# Patient Record
Sex: Male | Born: 1975 | Race: White | Hispanic: No | Marital: Married | Smoking: Never smoker
Health system: Southern US, Community
[De-identification: ages and names within clinical notes are randomized; demographics above are authoritative.]

## PROBLEM LIST (undated history)

## (undated) DIAGNOSIS — F418 Other specified anxiety disorders: Secondary | ICD-10-CM

---

## 2016-08-12 ENCOUNTER — Ambulatory Visit
Admission: EM | Admit: 2016-08-12 | Discharge: 2016-08-12 | Disposition: A | Discharge status: Home / Self Care | Payer: BLUE CROSS/BLUE SHIELD | Attending: Family Medicine | Admitting: Family Medicine

## 2016-08-12 ENCOUNTER — Ambulatory Visit (INDEPENDENT_AMBULATORY_CARE_PROVIDER_SITE_OTHER): Payer: BLUE CROSS/BLUE SHIELD

## 2016-08-12 DIAGNOSIS — S60212A Contusion of left wrist, initial encounter: Secondary | ICD-10-CM | POA: Diagnosis not present

## 2016-08-12 MED ORDER — HYDROCODONE-ACETAMINOPHEN 5-325 MG PO TABS: ORAL_TABLET | ORAL | 0 refills | Status: AC

## 2016-08-12 NOTE — ED Triage Notes (Signed)
Pt was horse playing with his kids and slipped and fell on the floor last night.

## 2016-08-12 NOTE — ED Provider Notes (Signed)
[facility]    CSN: [id number] Arrival date & time: 08/12/16  0817     History   Chief Complaint Chief Complaint  Patient presents with  . Wrist Pain    Left wrist    HPI Bobby Combs is a 41 y.o. male.   The history is provided by the patient.  Wrist Pain  This is a new problem. The current episode started 12 to 24 hours ago (after falling on hard floor last night while playing with kids). The problem occurs constantly. The problem has not changed since onset.Pertinent negatives include no chest pain. He has tried nothing for the symptoms.    History reviewed. No pertinent past medical history.  There are no active problems to display for this patient.   History reviewed. No pertinent surgical history.     Home Medications    Prior to Admission medications   Medication Sig Start Date End Date Taking? Authorizing Provider  desvenlafaxine (PRISTIQ) 50 MG 24 hr tablet Take 50 mg by mouth daily.   Yes Historical Provider, MD  divalproex (DEPAKOTE ER) 500 MG 24 hr tablet Take by mouth daily.   Yes Historical Provider, MD  escitalopram (LEXAPRO) 20 MG tablet Take 20 mg by mouth daily.   Yes Historical Provider, MD  lamoTRIgine (LAMICTAL) 200 MG tablet Take 200 mg by mouth daily.   Yes Historical Provider, MD  traZODone (DESYREL) 50 MG tablet Take 50 mg by mouth at bedtime.   Yes Historical Provider, MD  HYDROcodone-acetaminophen (NORCO/VICODIN) 5-325 MG tablet 1-2 tabs po qd prn 08/12/16   Payton Mccallumrlando Liberty Seto, MD    Family History History reviewed. No pertinent family history.  Social History Social History  Substance Use Topics  . Smoking status: Never Smoker  . Smokeless tobacco: Never Used  . Alcohol use Yes     Allergies   Patient has no known allergies.   Review of Systems Review of Systems  Cardiovascular: Negative for chest pain.     Physical Exam Triage Vital Signs ED Triage Vitals  Enc Vitals Group     BP 08/12/16 0832 (!) 137/99   Pulse Rate 08/12/16 0832 76     Resp 08/12/16 0832 18     Temp 08/12/16 0832 98 F (36.7 C)     Temp Source 08/12/16 0832 Oral     SpO2 08/12/16 0832 98 %     Weight 08/12/16 0832 205 lb (93 kg)     Height 08/12/16 0832 6' (1.829 m)     Head Circumference --      Peak Flow --      Pain Score 08/12/16 0834 5     Pain Loc --      Pain Edu? --      Excl. in GC? --    No data found.   Updated Vital Signs BP (!) 137/99 (BP Location: Left Arm)   Pulse 76   Temp 98 F (36.7 C) (Oral)   Resp 18   Ht 6' (1.829 m)   Wt 205 lb (93 kg)   SpO2 98%   BMI 27.80 kg/m   Visual Acuity Right Eye Distance:   Left Eye Distance:   Bilateral Distance:    Right Eye Near:   Left Eye Near:    Bilateral Near:     Physical Exam  Constitutional: He appears well-developed and well-nourished. No distress.  Musculoskeletal:       Left wrist: He exhibits tenderness, bony tenderness and swelling (mild). He exhibits  normal range of motion, no effusion, no crepitus, no deformity and no laceration.  Skin: He is not diaphoretic.  Nursing note and vitals reviewed.    UC Treatments / Results  Labs (all labs ordered are listed, but only abnormal results are displayed) Labs Reviewed - No data to display  EKG  EKG Interpretation None       Radiology Dg Wrist Complete Left  Result Date: 08/12/2016 CLINICAL DATA:  Pain secondary to a fall last night. EXAM: LEFT WRIST - COMPLETE 3+ VIEW COMPARISON:  None. FINDINGS: There is no evidence of fracture or dislocation. There is no evidence of arthropathy or other focal bone abnormality. Small accessory ossification center in the triangular fibrocartilage. IMPRESSION: Negative. Electronically Signed   By: Francene Boyers M.D.   On: 08/12/2016 09:12    Procedures Procedures (including critical care time)  Medications Ordered in UC Medications - No data to display   Initial Impression / Assessment and Plan / UC Course  I have reviewed the triage  vital signs and the nursing notes.  Pertinent labs & imaging results that were available during my care of the patient were reviewed by me and considered in my medical decision making (see chart for details).       Final Clinical Impressions(s) / UC Diagnoses   Final diagnoses:  Contusion of left wrist, initial encounter    New Prescriptions Discharge Medication List as of 08/12/2016  9:25 AM    START taking these medications   Details  HYDROcodone-acetaminophen (NORCO/VICODIN) 5-325 MG tablet 1-2 tabs po qd prn, Print       1. x-ray results (negative for fracture) and diagnosis reviewed with patient 2. rx as per orders above; reviewed possible side effects, interactions, risks and benefits  3. Recommend supportive treatment with ice, rest, wrist splint prn, range of motion  4. Follow-up prn if symptoms worsen or don't improve   [name], MD 08/12/16 [phone]

## 2016-09-25 ENCOUNTER — Encounter: Payer: Self-pay | Admitting: *Deleted

## 2016-09-25 ENCOUNTER — Ambulatory Visit
Admission: EM | Admit: 2016-09-25 | Discharge: 2016-09-25 | Disposition: A | Discharge status: Home / Self Care | Payer: BLUE CROSS/BLUE SHIELD | Attending: Family Medicine | Admitting: Family Medicine

## 2016-09-25 DIAGNOSIS — S60212A Contusion of left wrist, initial encounter: Secondary | ICD-10-CM

## 2016-09-25 HISTORY — DX: Other specified anxiety disorders: F41.8

## 2016-09-25 NOTE — ED Provider Notes (Signed)
[facility]    CSN: [id number] Arrival date & time: 09/25/16  1041     History   Chief Complaint Chief Complaint  Patient presents with  . Follow-up  . Wrist Injury    HPI Bobby Combs is a 41 y.o. male.   41 yo male with a h/o left wrist contusion one month ago here for recheck and to get note for work stating he has no restrictions. Does not have any other complaints. States his symptoms resolved and his wrist feels fine.    The history is provided by the patient.    Past Medical History:  Diagnosis Date  . Depression with anxiety     There are no active problems to display for this patient.   History reviewed. No pertinent surgical history.     Home Medications    Prior to Admission medications   Medication Sig Start Date End Date Taking? Authorizing Provider  desvenlafaxine (PRISTIQ) 50 MG 24 hr tablet Take 50 mg by mouth daily.   Yes Historical Provider, MD  divalproex (DEPAKOTE ER) 500 MG 24 hr tablet Take by mouth daily.   Yes Historical Provider, MD  escitalopram (LEXAPRO) 20 MG tablet Take 20 mg by mouth daily.   Yes Historical Provider, MD  lamoTRIgine (LAMICTAL) 200 MG tablet Take 200 mg by mouth daily.   Yes Historical Provider, MD  traZODone (DESYREL) 50 MG tablet Take 50 mg by mouth at bedtime.   Yes Historical Provider, MD  HYDROcodone-acetaminophen (NORCO/VICODIN) 5-325 MG tablet 1-2 tabs po qd prn 08/12/16   Payton Mccallumrlando Michaiah Holsopple, MD    Family History History reviewed. No pertinent family history.  Social History Social History  Substance Use Topics  . Smoking status: Never Smoker  . Smokeless tobacco: Never Used  . Alcohol use Yes     Allergies   Patient has no known allergies.   Review of Systems Review of Systems   Physical Exam Triage Vital Signs ED Triage Vitals  Enc Vitals Group     BP 09/25/16 1226 120/85     Pulse Rate 09/25/16 1226 62     Resp 09/25/16 1226 16     Temp 09/25/16 1226 98.2 F (36.8 C)   Temp Source 09/25/16 1226 Oral     SpO2 09/25/16 1226 100 %     Weight --      Height --      Head Circumference --      Peak Flow --      Pain Score 09/25/16 1230 0     Pain Loc --      Pain Edu? --      Excl. in GC? --    No data found.   Updated Vital Signs BP 120/85 (BP Location: Left Arm)   Pulse 62   Temp 98.2 F (36.8 C) (Oral)   Resp 16   SpO2 100%   Visual Acuity Right Eye Distance:   Left Eye Distance:   Bilateral Distance:    Right Eye Near:   Left Eye Near:    Bilateral Near:     Physical Exam  Constitutional: He appears well-developed and well-nourished. No distress.  Musculoskeletal:       Left wrist: Normal.  Skin: He is not diaphoretic.  Nursing note and vitals reviewed.    UC Treatments / Results  Labs (all labs ordered are listed, but only abnormal results are displayed) Labs Reviewed - No data to display  EKG  EKG Interpretation None  Radiology No results found.  Procedures Procedures (including critical care time)  Medications Ordered in UC Medications - No data to display   Initial Impression / Assessment and Plan / UC Course  I have reviewed the triage vital signs and the nursing notes.  Pertinent labs & imaging results that were available during my care of the patient were reviewed by me and considered in my medical decision making (see chart for details).       Final Clinical Impressions(s) / UC Diagnoses   Final diagnoses:  Contusion of left wrist, initial encounter    New Prescriptions Discharge Medication List as of 09/25/2016 12:59 PM     1. Resolved 2. Patient may return to his regular job duties without any restrictions.  3. f/u prn   [name], MD 09/25/16 [phone]

## 2016-09-25 NOTE — Discharge Instructions (Signed)
May return to work without restrictions.

## 2016-09-25 NOTE — ED Triage Notes (Signed)
Patient has arrived for left wrist injury follow up and release to work note.

## 2018-12-25 IMAGING — CR DG WRIST COMPLETE 3+V*L*
4 series · 4 of 4 positions shown · non-contrast
Comparison: None.

CLINICAL DATA: Pain secondary to a fall last night.

EXAM:
LEFT WRIST - COMPLETE 3+ VIEW

[wrist pa]
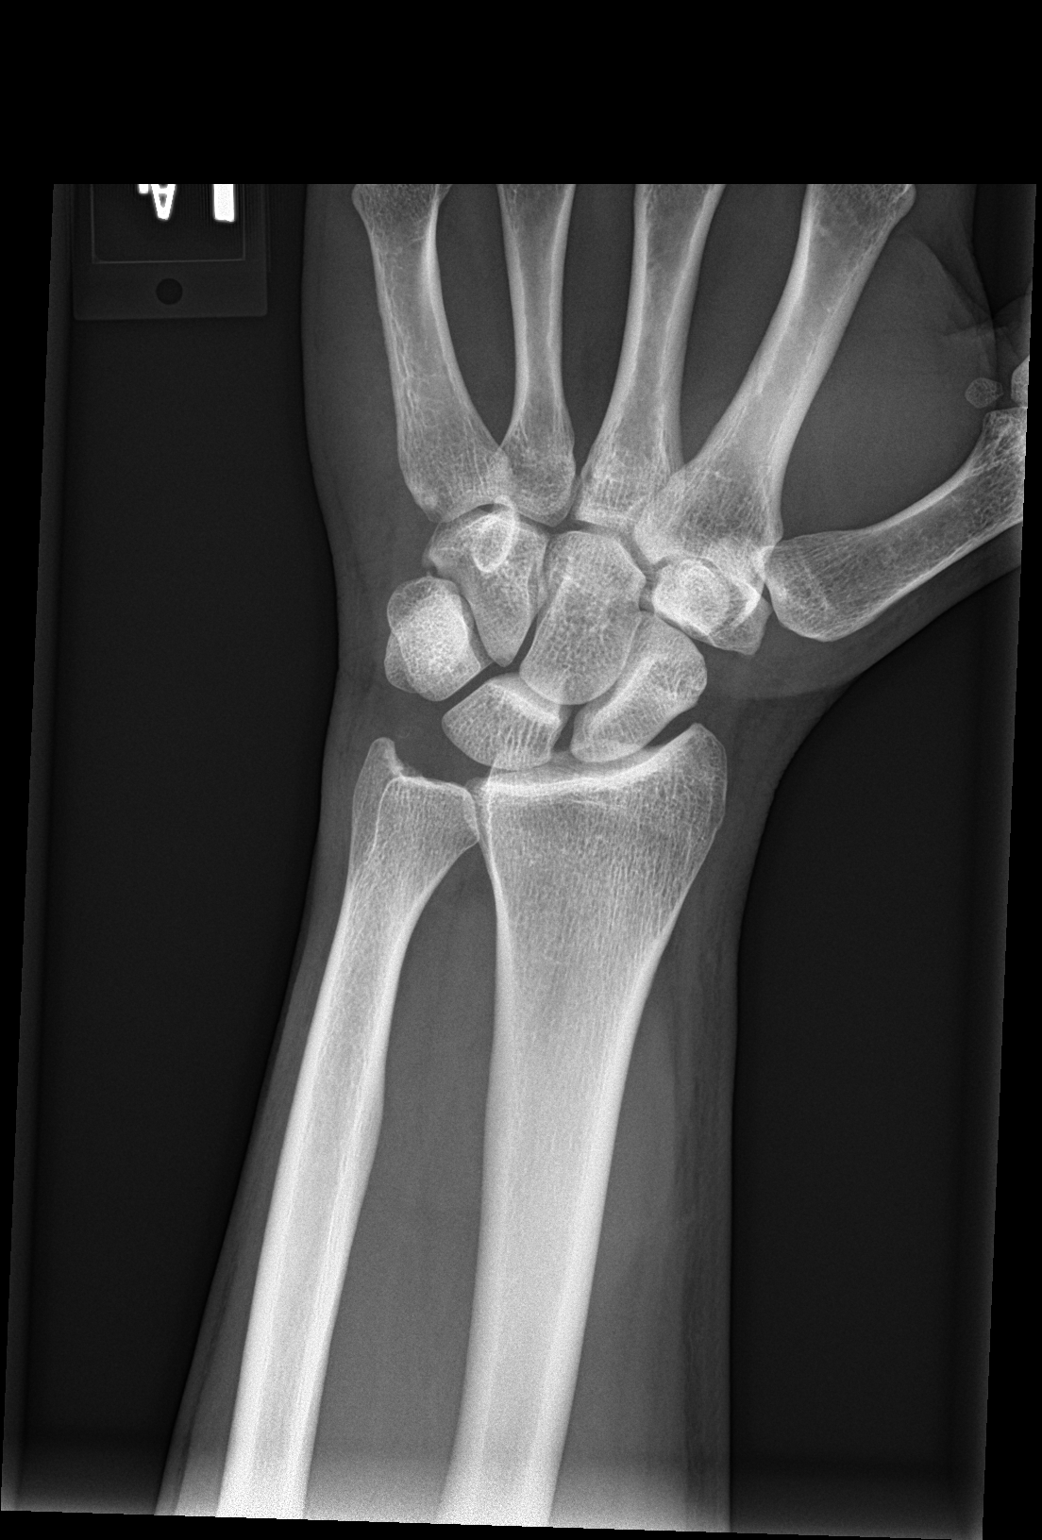

[wrist obl]
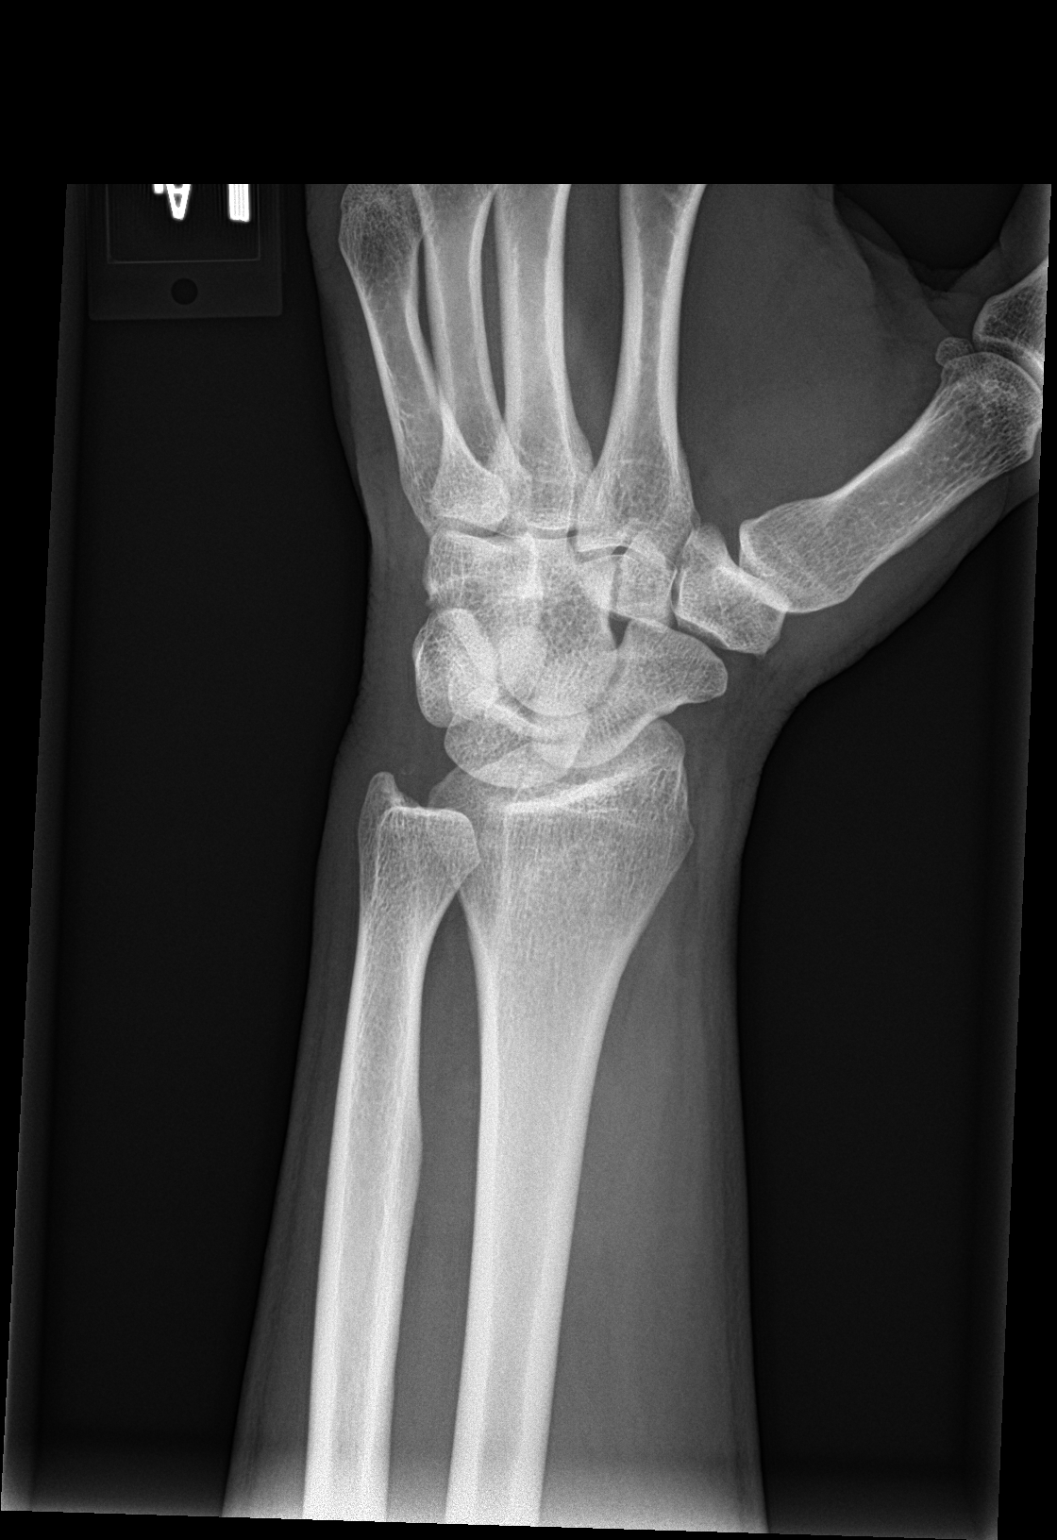

[wrist lat]
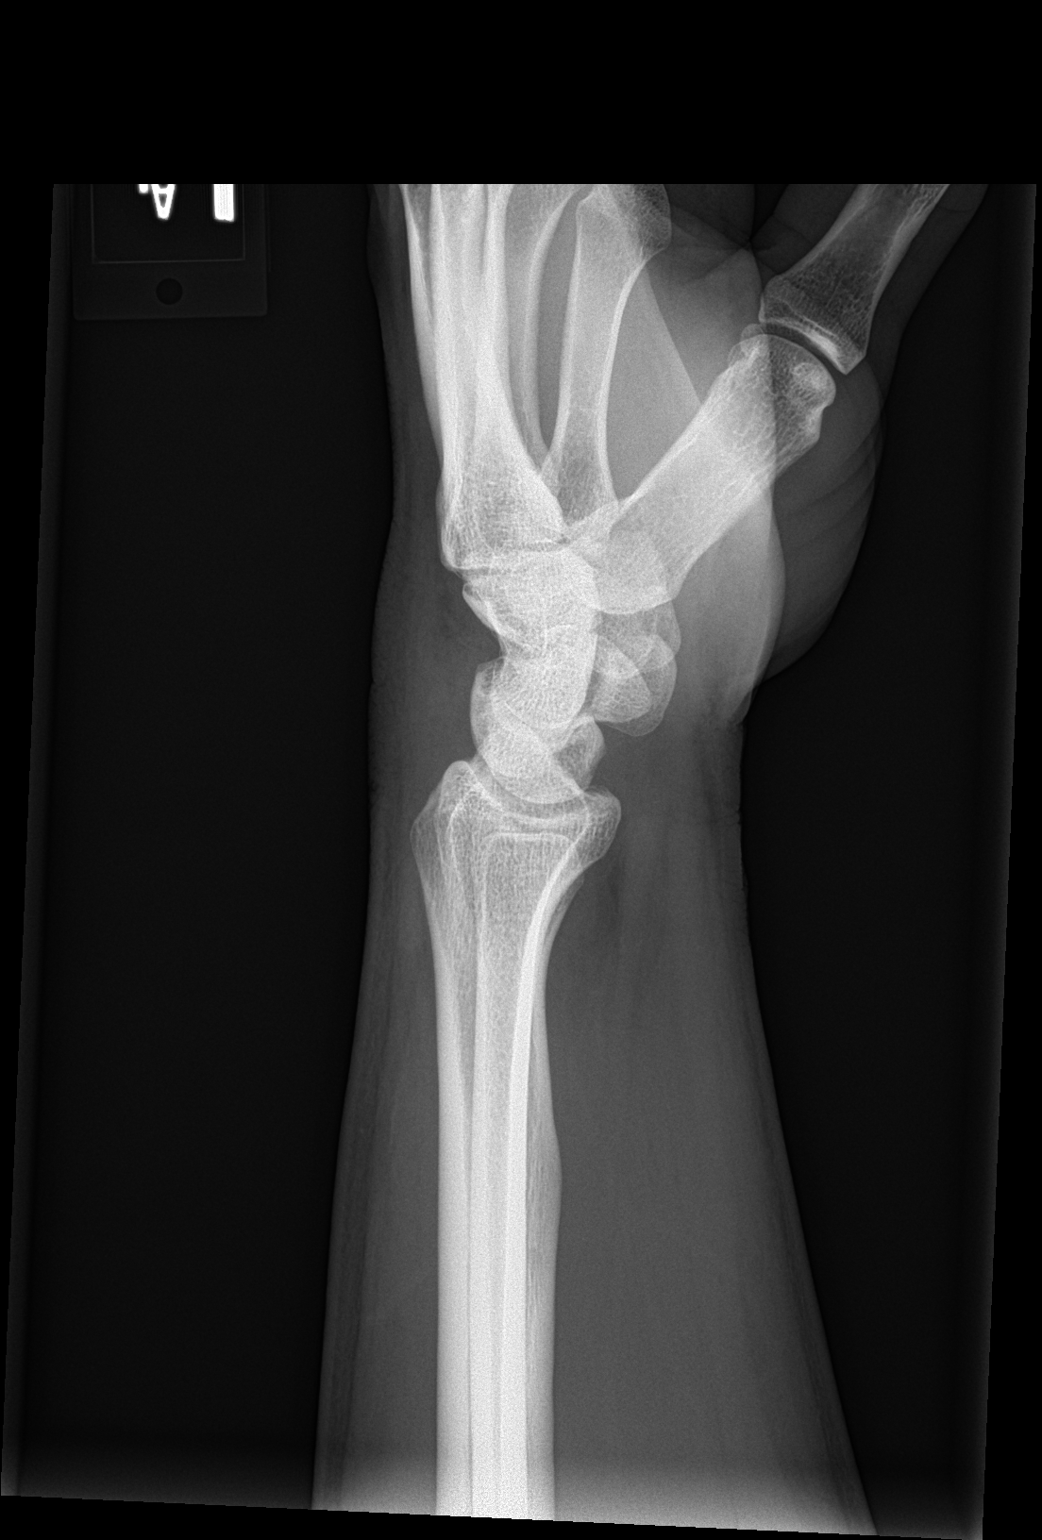

[wrist navicular]
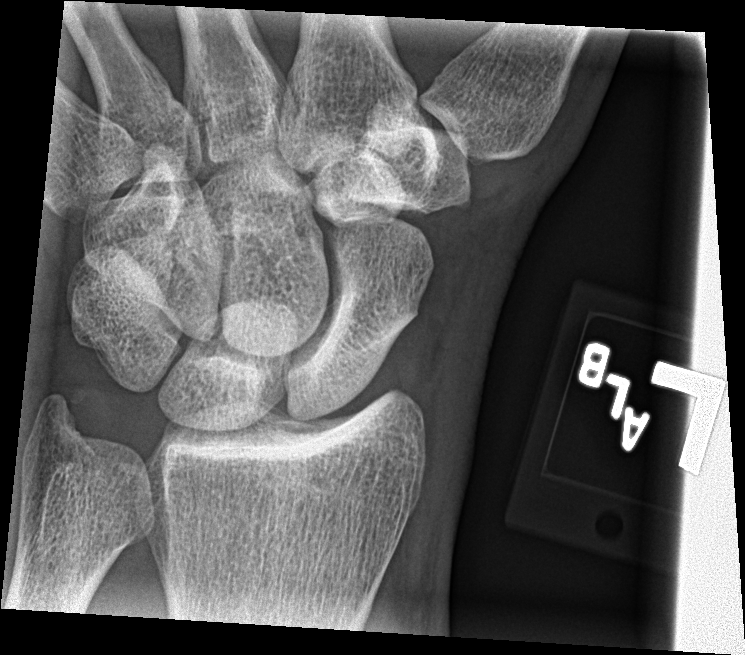

[4 of 4 positions shown; findings below may reference images not displayed]

FINDINGS: There is no evidence of fracture or dislocation. There is no
evidence of arthropathy or other focal bone abnormality. Small
accessory ossification center in the triangular fibrocartilage.
IMPRESSION: Negative.
